# Patient Record
Sex: Male | Born: 1950 | Hispanic: No | Marital: Married | Smoking: Former smoker
Health system: Southern US, Community
[De-identification: ages and names within clinical notes are randomized; demographics above are authoritative.]

## PROBLEM LIST (undated history)

## (undated) DIAGNOSIS — E785 Hyperlipidemia, unspecified: Secondary | ICD-10-CM

## (undated) DIAGNOSIS — I10 Essential (primary) hypertension: Secondary | ICD-10-CM

## (undated) DIAGNOSIS — E291 Testicular hypofunction: Secondary | ICD-10-CM

## (undated) DIAGNOSIS — R739 Hyperglycemia, unspecified: Secondary | ICD-10-CM

## (undated) DIAGNOSIS — G4730 Sleep apnea, unspecified: Secondary | ICD-10-CM

## (undated) DIAGNOSIS — IMO0001 Reserved for inherently not codable concepts without codable children: Secondary | ICD-10-CM

## (undated) DIAGNOSIS — R030 Elevated blood-pressure reading, without diagnosis of hypertension: Secondary | ICD-10-CM

## (undated) HISTORY — DX: Hyperglycemia, unspecified: R73.9

## (undated) HISTORY — PX: OTHER SURGICAL HISTORY: SHX169

## (undated) HISTORY — DX: Reserved for inherently not codable concepts without codable children: IMO0001

## (undated) HISTORY — PX: ANTERIOR CRUCIATE LIGAMENT REPAIR: SHX115

## (undated) HISTORY — DX: Elevated blood-pressure reading, without diagnosis of hypertension: R03.0

## (undated) HISTORY — PX: APPENDECTOMY: SHX54

## (undated) HISTORY — DX: Testicular hypofunction: E29.1

## (undated) HISTORY — DX: Hyperlipidemia, unspecified: E78.5

## (undated) HISTORY — DX: Essential (primary) hypertension: I10

## (undated) HISTORY — DX: Sleep apnea, unspecified: G47.30

---

## 2007-08-02 ENCOUNTER — Ambulatory Visit: Payer: Self-pay | Admitting: Internal Medicine

## 2007-08-02 ENCOUNTER — Encounter (INDEPENDENT_AMBULATORY_CARE_PROVIDER_SITE_OTHER): Payer: Self-pay | Admitting: Nurse Practitioner

## 2007-08-02 LAB — CONVERTED CEMR LAB
ALT: 24 units/L (ref 0–53)
AST: 21 units/L (ref 0–37)
Alkaline Phosphatase: 43 units/L (ref 39–117)
Basophils Absolute: 0 10*3/uL (ref 0.0–0.1)
Basophils Relative: 0 % (ref 0–1)
Chloride: 109 meq/L (ref 96–112)
Creatinine, Ser: 0.72 mg/dL (ref 0.40–1.50)
Eosinophils Absolute: 0.3 10*3/uL (ref 0.0–0.7)
Eosinophils Relative: 6 % — ABNORMAL HIGH (ref 0–5)
HDL: 45 mg/dL (ref >39)
Hemoglobin: 14.7 g/dL (ref 13.0–17.0)
MCHC: 34.8 g/dL (ref 30.0–36.0)
MCV: 88.3 fL (ref 78.0–100.0)
Monocytes Absolute: 0.5 10*3/uL (ref 0.1–1.0)
Neutro Abs: 3.5 10*3/uL (ref 1.7–7.7)
PSA: 0.84 ng/mL (ref 0.10–4.00)
RDW: 13.4 % (ref 11.5–15.5)
TSH: 1.326 microintl units/mL (ref 0.350–5.50)
Testosterone Free: 44.3 pg/mL — ABNORMAL LOW (ref 47.0–244.0)
Total Bilirubin: 0.5 mg/dL (ref 0.3–1.2)
Total CHOL/HDL Ratio: 5.5
VLDL: 44 mg/dL — ABNORMAL HIGH (ref 0–40)

## 2007-08-07 ENCOUNTER — Encounter (INDEPENDENT_AMBULATORY_CARE_PROVIDER_SITE_OTHER): Payer: Self-pay | Admitting: Nurse Practitioner

## 2007-08-25 ENCOUNTER — Ambulatory Visit: Payer: Self-pay | Admitting: Family Medicine

## 2007-08-28 ENCOUNTER — Ambulatory Visit: Payer: Self-pay | Admitting: *Deleted

## 2007-09-22 ENCOUNTER — Ambulatory Visit: Payer: Self-pay | Admitting: Internal Medicine

## 2008-12-17 ENCOUNTER — Ambulatory Visit: Payer: Self-pay | Admitting: Internal Medicine

## 2008-12-17 DIAGNOSIS — E785 Hyperlipidemia, unspecified: Secondary | ICD-10-CM | POA: Insufficient documentation

## 2008-12-17 DIAGNOSIS — E291 Testicular hypofunction: Secondary | ICD-10-CM

## 2008-12-17 DIAGNOSIS — R030 Elevated blood-pressure reading, without diagnosis of hypertension: Secondary | ICD-10-CM

## 2008-12-20 ENCOUNTER — Ambulatory Visit (HOSPITAL_COMMUNITY): Admission: EM | Admit: 2008-12-20 | Discharge: 2008-12-21 | Payer: Self-pay | Admitting: Emergency Medicine

## 2008-12-25 LAB — CONVERTED CEMR LAB
CO2: 29 meq/L (ref 19–32)
Calcium: 8.4 mg/dL (ref 8.4–10.5)
Chloride: 103 meq/L (ref 96–112)
Creatinine, Ser: 0.9 mg/dL (ref 0.4–1.5)
Glucose, Bld: 104 mg/dL — ABNORMAL HIGH (ref 70–99)
Hemoglobin: 14.1 g/dL (ref 13.0–17.0)
MCHC: 34.6 g/dL (ref 30.0–36.0)
MCV: 85.5 fL (ref 78.0–100.0)
Prolactin: 8 ng/mL
RDW: 12.2 % (ref 11.5–14.6)
Testosterone: 219.66 ng/dL — ABNORMAL LOW (ref 350.00–890.00)

## 2010-10-12 LAB — CBC
Hemoglobin: 15.4 g/dL (ref 13.0–17.0)
MCHC: 33.9 g/dL (ref 30.0–36.0)
MCV: 84.7 fL (ref 78.0–100.0)
RBC: 5.35 MIL/uL (ref 4.22–5.81)
RDW: 13.3 % (ref 11.5–15.5)

## 2010-10-12 LAB — DIFFERENTIAL
Basophils Absolute: 0 10*3/uL (ref 0.0–0.1)
Basophils Relative: 0 % (ref 0–1)
Eosinophils Absolute: 0.2 10*3/uL (ref 0.0–0.7)
Eosinophils Relative: 2 % (ref 0–5)
Monocytes Absolute: 0.4 10*3/uL (ref 0.1–1.0)
Monocytes Relative: 5 % (ref 3–12)
Neutro Abs: 6.8 10*3/uL (ref 1.7–7.7)

## 2010-10-12 LAB — PROTIME-INR
INR: 1 (ref 0.00–1.49)
Prothrombin Time: 13.9 seconds (ref 11.6–15.2)

## 2010-10-12 LAB — BASIC METABOLIC PANEL
CO2: 24 mEq/L (ref 19–32)
Calcium: 8.7 mg/dL (ref 8.4–10.5)
Chloride: 107 mEq/L (ref 96–112)
GFR calc Af Amer: >60 mL/min (ref >60)
Glucose, Bld: 131 mg/dL — ABNORMAL HIGH (ref 70–99)
Sodium: 138 mEq/L (ref 135–145)

## 2010-11-17 NOTE — Op Note (Signed)
Bobby Clayton, Bobby Clayton NO.:  [id number]   MEDICAL RECORD NO.:  [id number]          PATIENT TYPE:  INP   LOCATION:  1612                         FACILITY:  [facility]   PHYSICIAN:  Dionne Ano. 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   PROCEDURE:  1. I and D of skin, subcutaneous tissue, bone and nailbed tissue,      right thumb, excisional debridement.  2. Open treatment of distal phalanx fracture, right thumb.  3. Nail bed repair, right thumb.  4. Nail plate removal, right thumb.  5. Moberg advancement flap, right thumb.   SURGEON:  Dionne Ano. Amanda Pea, M.D.   ASSISTANT:  None.   COMPLICATIONS:  None.   ANESTHESIA:  General with axillary block.   TOURNIQUET TIME:  Less than 30 minutes.   INDICATIONS FOR PROCEDURE:  This patient is a 59 year old male who  presents with the above-mentioned diagnosis.  I have counseled him  regarding the risks and benefits of surgery including the risk of  infection, bleeding, anesthesia, damage to normal structures and failure  of surgery to accomplish its intended goals to relieve his symptoms and  restore function.  With this in mind, we will proceed.  All questions  have been encouraged and answered preoperatively.   OPERATIVE PROCEDURE:  With the patient in the supine position, given  anesthesia, taken to the operative suite, time-out was called.  Preoperative checks performed.  The patient was prepped and draped in  the usual sterile fashion.  He had a general anesthetic employed by Dr.  Lucille Passy and  the anesthesia department.  An axillary block was  placed in the holding area and was slightly incomplete.  Once this was  done, the patient then underwent an I and D of skin, subcutaneous  tissue, bone, nailbed, and nail plate tissue.  I then performed nail  plate removal without difficulty.  Greater than 4 liters of saline were  placed through the wound.  This was curettaged and treated.   Once this was done, I performed open treatment of the distal phalanx  fracture with a rongeur, bone-biting instrument, and set the remaining  bone.   Following this, I then performed an incision about the mid lateral line.  The neurovascular bundles were kept volar.  The flexor tendon and its  sheath were kept intact and a flap was elevated nicely.  The large  tongue flap was then inset and advanced as a Moberg advancement flap.  The thumb tip was bent slightly at the PIP joint to accommodate this and  following this, nailbed repair was accomplished with chromic suture and  the flap was then inset with chromic of the 4-0  variety.  The tourniquet  was deflated at less than 30 minutes.  Excellent refill both dorsally  and volarly was noted and I was quite pleased with this and the findings  at this juncture.  I went ahead and performed placement of Adaptic under  the eponychial fold.  Xeroform and Adaptic gauze and a thumb spica  splint were applied without difficulty.  The patient tolerated this  well.   He was to the recovery room in stable condition.  All sponge and needle  counts were reported as correct.   Will plan to see him back in the office in 10-14 days as planned for  suture removal, observation.  Therapy referral for gentle interval range  of motion and once his wound is healed and motion is back, begin  strengthening.  Basically we simply want the wound to heal and will  guide him along with the healing process and perform soft tissue  endeavors.  Once the wound is solid, will then  begin aggressive active  range of motion and following restoration of motion and strengthening,  he will be able work until further notice.  He will be admitted for IV  antibiotics in the form of Ancef.  General postop observation and pain  management.  It has been a pleasure to participate in his care and we  look to participating in his postop recovery.      Dionne Ano. Amanda Pea, M.D.  Electronically Signed     WMG/MEDQ  D:  12/20/2008  T:  12/21/2008  Job:  [id number]

## 2011-03-24 ENCOUNTER — Encounter: Payer: Self-pay | Admitting: Internal Medicine

## 2011-03-29 ENCOUNTER — Encounter: Payer: Self-pay | Admitting: Internal Medicine

## 2011-03-29 ENCOUNTER — Telehealth: Payer: Self-pay | Admitting: Internal Medicine

## 2011-03-29 ENCOUNTER — Ambulatory Visit (INDEPENDENT_AMBULATORY_CARE_PROVIDER_SITE_OTHER)
Admission: RE | Admit: 2011-03-29 | Discharge: 2011-03-29 | Disposition: A | Discharge status: Home / Self Care | Payer: PRIVATE HEALTH INSURANCE | Source: Ambulatory Visit | Attending: Internal Medicine | Admitting: Internal Medicine

## 2011-03-29 ENCOUNTER — Ambulatory Visit (INDEPENDENT_AMBULATORY_CARE_PROVIDER_SITE_OTHER): Payer: PRIVATE HEALTH INSURANCE | Admitting: Internal Medicine

## 2011-03-29 DIAGNOSIS — R079 Chest pain, unspecified: Secondary | ICD-10-CM

## 2011-03-29 DIAGNOSIS — E119 Type 2 diabetes mellitus without complications: Secondary | ICD-10-CM

## 2011-03-29 LAB — COMPREHENSIVE METABOLIC PANEL
Alkaline Phosphatase: 38 U/L — ABNORMAL LOW (ref 39–117)
BUN: 14 mg/dL (ref 6–23)
Creatinine, Ser: 0.8 mg/dL (ref 0.4–1.5)
Glucose, Bld: 99 mg/dL (ref 70–99)
Sodium: 139 mEq/L (ref 135–145)
Total Bilirubin: 0.6 mg/dL (ref 0.3–1.2)

## 2011-03-29 LAB — CBC WITH DIFFERENTIAL/PLATELET
Basophils Relative: 0.3 % (ref 0.0–3.0)
Eosinophils Relative: 6 % — ABNORMAL HIGH (ref 0.0–5.0)
HCT: 42.8 % (ref 39.0–52.0)
Lymphs Abs: 1.8 10*3/uL (ref 0.7–4.0)
MCV: 86.4 fl (ref 78.0–100.0)
Monocytes Absolute: 0.5 10*3/uL (ref 0.1–1.0)
RBC: 4.96 Mil/uL (ref 4.22–5.81)
WBC: 7.1 10*3/uL (ref 4.5–10.5)

## 2011-03-29 LAB — LIPID PANEL
Cholesterol: 202 mg/dL — ABNORMAL HIGH (ref 0–200)
Total CHOL/HDL Ratio: 5
VLDL: 29 mg/dL (ref 0.0–40.0)

## 2011-03-29 LAB — LDL CHOLESTEROL, DIRECT: Direct LDL: 128.4 mg/dL

## 2011-03-29 MED ORDER — NITROGLYCERIN 0.4 MG SL SUBL: 0.4000 mg | SUBLINGUAL_TABLET | SUBLINGUAL | Status: AC | PRN

## 2011-03-29 NOTE — Telephone Encounter (Signed)
Please refer pt to a nutritionist, dx DM (I did not know how to do it)

## 2011-03-29 NOTE — Progress Notes (Signed)
  Subjective:    Patient ID: Bobby Clayton, male    DOB: 1951-04-10, 60 y.o.   MRN: [id number]  HPI Last office visit 12-2008, here with his wife One year history of dyspnea on exertion and chest pain. Pain is located to the left upper chest, no radiation, having consistent CP every time he exercises, it lasts "for a while" after he stops exercising. No associated cough, nausea, mild diaphoresis.  History of borderline diabetes, A1c 6.3 on 2010, [facility] for  followup Also complains of lower extremity varicose veins, they seem more prominent lately. Some skin discoloration..  Past Medical History  Diagnosis Date  . Hyperglycemia     A1C 6.1 08-2007  . Hypogonadism male   . Hyperlipidemia   . Elevated BP     borderline   Past Surgical History  Procedure Date  . 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 No nausea, vomiting, diarrhea, no blood in the stools. Orthopnea? Patient's wife reports he is using 2 pillows to sleep. Denies any anxiety or depression. Bilateral lower extremity edema at the end of the day No GERD, no abdominal pain    Objective:   Physical Exam  Constitutional: He is oriented to person, place, and time. He appears well-developed. No distress.       Overweight appearing  Neck: Normal range of motion. Neck supple. No thyromegaly present.       Short neck  Cardiovascular: Normal rate, regular rhythm and normal heart sounds.   No murmur heard. Pulmonary/Chest: Effort normal and breath sounds normal. No respiratory distress. He has no wheezes. He has no rales.  Abdominal: Soft. Bowel sounds are normal. He exhibits no distension.  There is no tenderness. There is no rebound and no guarding.  Musculoskeletal:       Pretibial hyperpigmentation distally. No large varicose  Veins, edema +/++++  Neurological: He is alert and oriented to person, place, and time.  Skin: He is not diaphoretic.  Psychiatric: He has a normal mood and affect. His behavior is normal. Thought content normal.          Assessment & Plan:  Presents with the following issues: 1. Chest pain consistent with stable angina 2. Diabetes 3. Dyspnea on exertion 4. Suspect sleep apnea (addendum, pt states had a test in the past and was rec a CPAP) 5. Lower extremity edema and skin discoloration 6. H/o low testosterone PLAN: Will obtain chest x-ray and labs EKG --- no acute changes  Continue with aspirin, add nitroglycerin Would refer to cardiology: Stress test versus cardiac catheterization since patient is diabetic and has classic symptoms. ER if symptoms severe At a later time will discuss  sleep apnea treatment  Prescribe medications if appropriate, based on labs Nutritionist referral Exercise program once he is cleared by cardiology. Come back to the office in one month

## 2011-03-29 NOTE — Patient Instructions (Signed)
Get the chest x-ray done  I will ask cardiology to see you soon Continue with aspirin, rest If chest pain, take nitroglycerin use nitroglycerine  under the  tongue every 5 minutes x3. If the pain is severe or prolonged: Go to the ER. Come back in one month

## 2011-03-30 ENCOUNTER — Telehealth: Payer: Self-pay | Admitting: *Deleted

## 2011-03-30 DIAGNOSIS — E119 Type 2 diabetes mellitus without complications: Secondary | ICD-10-CM | POA: Insufficient documentation

## 2011-03-30 MED ORDER — ATORVASTATIN CALCIUM 20 MG PO TABS: 20.0000 mg | ORAL_TABLET | Freq: Every day | ORAL | Status: DC

## 2011-03-30 NOTE — Telephone Encounter (Signed)
Done

## 2011-03-30 NOTE — Telephone Encounter (Signed)
Please place a Diagnosis for DM in patient's chart needed to be able to link/associate to Referral.

## 2011-03-30 NOTE — Telephone Encounter (Signed)
Message copied by Regis Bill on Tue Mar 30, 2011  9:35 AM ------      Message from: Wanda Plump      Created: Tue Mar 30, 2011  7:49 AM       Advise patient      Blood sugar test stable.      Cholesterol needs improvement, LDL is 128, goal is 100. Start Lipitor 20 mg once daily, call #[phone], 3 RF      Other labs normal

## 2011-03-30 NOTE — Telephone Encounter (Signed)
Patient informed of lab results and New Rx sent to pharmacy. Mailed copy of results. Scheduled appointment for Left knee pain 04/06/11 @ 10:30am

## 2011-03-31 NOTE — Telephone Encounter (Signed)
Referral Done.

## 2011-04-05 ENCOUNTER — Ambulatory Visit (INDEPENDENT_AMBULATORY_CARE_PROVIDER_SITE_OTHER): Payer: PRIVATE HEALTH INSURANCE | Admitting: Cardiology

## 2011-04-05 ENCOUNTER — Encounter: Payer: Self-pay | Admitting: Cardiology

## 2011-04-05 DIAGNOSIS — R079 Chest pain, unspecified: Secondary | ICD-10-CM

## 2011-04-05 DIAGNOSIS — E785 Hyperlipidemia, unspecified: Secondary | ICD-10-CM

## 2011-04-05 DIAGNOSIS — R0600 Dyspnea, unspecified: Secondary | ICD-10-CM

## 2011-04-05 DIAGNOSIS — R0609 Other forms of dyspnea: Secondary | ICD-10-CM

## 2011-04-05 NOTE — Patient Instructions (Signed)
We will schedule you for a lexiscan myoview study. (Nuclear stress test)  Continue your medications.  Lexiscan Stress Test Your caregiver has ordered a Lexiscan Stress Test with nuclear imaging. The purpose of this test is to evaluate the blood supply to your heart muscle. This procedure is referred to as a "Non-Invasive Stress Test." This is because other than having an IV started in your vein, nothing is inserted or "invades" your body. Cardiac stress tests are done to find areas of poor blood flow to the heart by determining the extent of coronary artery disease (CAD). Some patients exercise on a treadmill, which naturally increases the blood flow to your heart. However, almost half of the patients undergoing a treadmill stress test each year are unable to exercise adequately. This is due to various medical conditions. For these patients, a pharmacologic/chemical stress agent called Eugenie Birks is used on patients without a history of asthma. This medicine will mimic walking on a treadmill by temporarily increasing your coronary blood flow. The side effects of this medicine include:  Headache.   Dizziness.   Shortness of breath.   Flushing.   Chest pain/pressure.   Feeling sick to your stomach (nauseous).   Increased heart rate.   Abdominal discomfort.   Low blood pressure.  BEFORE THE PROCEDURE  Avoid all forms of caffeine 24 hours before your test. This includes coffee, tea (even decaffeinated brands), caffeinated sodas, chocolate, cocoa and certain pain medications that contain caffeine.   Do not eat anything 6 hours before the test. In hospitalized patients, this means nothing by mouth after midnight.   Patients with diabetes that are not hospitalized (outpatients) should talk to their caregiver about how much, if any, insulin or oral diabetic medications they should take the day of the test. You should also talk about the type of light snack that you should eat the morning of the  test.   Patients with diabetes that are hospitalized (inpatients) will follow the cardiologist's written instructions that will be given to you.   Outpatients should bring a snack. Use the hospital vending machines so that you may eat right after the stress phase.   Wear comfortable shoes. There is at least an hour wait before the stress phase of nuclear scanning is done. The total procedure time is approximately 3 hours.   The following medications should be stopped 24 hours before your stress test: Beta-blockers and nitroglycerine (patches or paste should be removed 4 hours before the test).   Women, tell your caregiver if there is any possibility that you may be pregnant or if you are currently breastfeeding.  PROCEDURE There are two separate nuclear images taken using a nuclear camera. These images require a small dose of a radiotracer called an isotope. Most diagnostic nuclear medicine procedures result in low radiation exposure. Allergic reactions to the isotope may include slight redness going up the arm and pain during the injection. However, these reactions are extremely rare and usually mild. Eugenie Birks is given very rapidly over 10-15 seconds in the vein (intravenously) followed by a nuclear isotope. The medication causes the arteries in your heart to widen (dilate), and the nuclear isotope lights up those arteries so that the nuclear images are clear. Together, this shows whether the coronary blood flow is normal or abnormal. During this stress phase, you will be connected to an EKG machine. Your blood pressure and oxygen levels will be monitored by the cardiologist and cardiac nurse. This part usually lasts 5-10 minutes. For inpatients, the  procedure is done in the patient's room. For outpatients, the procedure is done in the stress lab.  The "Resting Phase" is done before the Lexiscan injection and shows how your heart functions at rest. The "Stress Phase" is done about 1 hour after the  Lexiscan injection and determines how your heart functions under stress.  LEXISCAN STRESS TEST IS USEFUL FOR DETERMINING:  The adequacy of blood flow to your heart during increased levels of activity in order to clear you for discharge home.   The extent of coronary artery blockage caused by CAD.   Your prognosis if you have suffered a heart attack.   The effectiveness of cardiac procedures done, such as an angioplasty, which can increase the circulation in your coronary arteries.   Cause(s) of chest pain/pressure.  RESULTS Not all test results are available during your visit. If your test results are not back during the visit, make an appointment with your caregiver to find out the results. Do not assume everything is normal if you have not heard from your caregiver or the medical facility. It is important for you to follow up on all of your test results. Document Released: 11/07/2008  [facility] Patient Information 2011 [place], [place].

## 2011-04-06 ENCOUNTER — Ambulatory Visit (INDEPENDENT_AMBULATORY_CARE_PROVIDER_SITE_OTHER): Payer: PRIVATE HEALTH INSURANCE | Admitting: Internal Medicine

## 2011-04-06 ENCOUNTER — Encounter: Payer: Self-pay | Admitting: Internal Medicine

## 2011-04-06 VITALS — BP 146/90 | HR 87 | Temp 98.7°F | Resp 16 | Wt 239.4 lb

## 2011-04-06 DIAGNOSIS — M25569 Pain in unspecified knee: Secondary | ICD-10-CM

## 2011-04-06 DIAGNOSIS — R079 Chest pain, unspecified: Secondary | ICD-10-CM | POA: Insufficient documentation

## 2011-04-06 MED ORDER — HYDROCODONE-ACETAMINOPHEN 7.5-750 MG PO TABS: 1.0000 | ORAL_TABLET | Freq: Every evening | ORAL | Status: AC | PRN

## 2011-04-06 NOTE — Patient Instructions (Signed)
Motrin during the day vicodin at night Came back in about 3 weeks as planned

## 2011-04-06 NOTE — Assessment & Plan Note (Signed)
He does have symptoms of exertional chest pain and dyspnea. He has multiple cardiac risk factors including diabetes and hyperlipidemia. There is some family history of cardiac disease. I recommended a nuclear stress test to further evaluate his cardiac risk. Given his recent knee problems he will not be able to walk on a treadmill so we will schedule him for a Lexiscan Myoview study. If this is normal then he may need to be evaluated for possible sleep apnea.

## 2011-04-06 NOTE — Assessment & Plan Note (Addendum)
Left knee pain for one month, getting worse, reports a previous anterior cruciate ligament surgery in that knee ears ago. Plan: Continue with Motrin which is helping, GI side effects discussed Vicodin at night Refer back to orthopedic surgery, reports that he was seen by [facility] ortho

## 2011-04-06 NOTE — Progress Notes (Signed)
Bobby Clayton Date of Birth: 06-04-1951   History of Present Illness: [name] is a pleasant 60 year old white male who is seen at the request of Dr. Drue Novel for evaluation of dyspnea and chest pain. He reports that his wife says that he pants a lot. It is worse with exertion. Initially he said that he doesn't feel short of breath but on further questioning he has experienced left parasternal pain and difficulty breathing with exertion. This is relieved with rest. He cannot recall how long this has been going on. There is concern that he may also have sleep apnea. He has had no prior cardiac evaluation. He does have one sister who had a heart disease and had to have surgery.  Current Outpatient Prescriptions on File Prior to Visit  Medication Sig Dispense Refill  . aspirin 81 MG tablet Take 81 mg by mouth daily.        Marland Kitchen atorvastatin (LIPITOR) 20 MG tablet Take 1 tablet (20 mg total) by mouth daily.  30 tablet  3  . fish oil-omega-3 fatty acids 1000 MG capsule Take 1 g by mouth daily.        . Multiple Vitamins-Minerals (CENTRUM ULTRA MENS PO) Take 1 each by mouth daily.        Marland Kitchen NIACIN PO Take by mouth daily.       . nitroGLYCERIN (NITROSTAT) 0.4 MG SL tablet Place 1 tablet (0.4 mg total) under the tongue every 5 (five) minutes as needed for chest pain.  50 tablet  0    Allergies  Allergen Reactions  . Cyanocobalamin Itching    B12.    Past Medical History  Diagnosis Date  . Hyperglycemia     A1C 6.1 08-2007  . Hypogonadism male   . Hyperlipidemia   . Elevated BP     borderline    Past Surgical History  Procedure Date  . Appendectomy   . Anterior cruciate ligament repair     left    History  Smoking status  . Former Smoker  Smokeless tobacco  . Not on file    History  Alcohol Use  . Yes    socially    Family History  Problem Relation Age of Onset  . Cancer Father     colon  . Heart disease Sister     surgery    Review of Systems: The review of systems  is positive for left knee pain. This has limited his activity. He has had a previous anterior cruciate ligament repair in that knee..  All other systems were reviewed and are negative.  Physical Exam: BP 140/84  Pulse 80  Ht 5\' 7"  (1.702 m)  Wt 240 lb (108.863 kg)  BMI 37.59 kg/m2 The patient is alert and oriented x 3.  The mood and affect are normal.  The skin is warm and dry.  Color is normal.  The HEENT exam reveals that the sclera are nonicteric.  The mucous membranes are moist.  The carotids are 2+ without bruits.  There is no thyromegaly.  There is no JVD.  The lungs are clear.  The chest wall is non tender.  The heart exam reveals a regular rate with a normal S1 and S2.  There are no murmurs, gallops, or rubs.  The PMI is not displaced.   Abdominal exam reveals good bowel sounds.  There is no guarding or rebound.  There is no hepatosplenomegaly or tenderness.  There are no masses.  Exam of the  legs reveal no clubbing, cyanosis, or edema.  The legs are without rashes.  The distal pulses are intact.  Cranial nerves II - XII are intact.  Motor and sensory functions are intact.  The gait is normal.  LABORATORY DATA: From 03/30/2011 his complete chemistry panel was unremarkable. CBC was unremarkable. TSH was 0.9. Total cholesterol 202, triglycerides 145, HDL 41, LDL 128. A1c was 6.4%. ECG today is normal.  Assessment / Plan:

## 2011-04-06 NOTE — Progress Notes (Signed)
  Subjective:    Patient ID: Bobby Clayton, male    DOB: 09-09-1950, 60 y.o.   MRN: [id number]  HPI Knee pain x 1 month, started 5 days after he started a walking program. Initially on-off, now pain is constatnt , even at night, throbbing Past Medical History  Diagnosis Date  . Hyperglycemia     A1C 6.1 08-2007  . Hypogonadism male   . Hyperlipidemia   . Elevated BP     borderline   Past Surgical History  Procedure Date  . Appendectomy   . Anterior cruciate ligament repair     left      Review of Systems No actual knee swelling, calf pain or warmness. No locking but occasionally the knee gave up    Objective:   Physical Exam Alert, oriented, in no apparent distress Towels symmetric and nontender Right knee normal Left knee: Well-healed surgical scars, no red or hot, very mild swelling? Slightly decrease range of motion, unable to fully flex. No pain to lateral pressure , drawer sign negative        Assessment & Plan:

## 2011-04-07 ENCOUNTER — Encounter: Payer: Self-pay | Admitting: *Deleted

## 2011-04-07 NOTE — Assessment & Plan Note (Signed)
To have a stress test soon, per pt: cardiology is aware of knee pain, he will not do a treadmill.

## 2011-04-14 ENCOUNTER — Ambulatory Visit (HOSPITAL_COMMUNITY): Payer: PRIVATE HEALTH INSURANCE | Attending: Cardiology | Admitting: Radiology

## 2011-04-14 DIAGNOSIS — R0789 Other chest pain: Secondary | ICD-10-CM

## 2011-04-14 DIAGNOSIS — E119 Type 2 diabetes mellitus without complications: Secondary | ICD-10-CM

## 2011-04-14 DIAGNOSIS — R0989 Other specified symptoms and signs involving the circulatory and respiratory systems: Secondary | ICD-10-CM

## 2011-04-14 DIAGNOSIS — R079 Chest pain, unspecified: Secondary | ICD-10-CM | POA: Insufficient documentation

## 2011-04-14 MED ORDER — TECHNETIUM TC 99M TETROFOSMIN IV KIT
11.0000 | PACK | Freq: Once | INTRAVENOUS | Status: AC | PRN
  Administered 2011-04-14: 11 via INTRAVENOUS

## 2011-04-14 MED ORDER — TECHNETIUM TC 99M TETROFOSMIN IV KIT
33.0000 | PACK | Freq: Once | INTRAVENOUS | Status: AC | PRN
  Administered 2011-04-14: 33 via INTRAVENOUS

## 2011-04-14 MED ORDER — REGADENOSON 0.4 MG/5ML IV SOLN
0.4000 mg | Freq: Once | INTRAVENOUS | Status: AC
  Administered 2011-04-14: 0.4 mg via INTRAVENOUS

## 2011-04-14 NOTE — Progress Notes (Signed)
[facility] SITE 3 NUCLEAR MED [address] [place] [address] [phone]  Cardiology Nuclear Med Study  Bobby Clayton is a 60 y.o. male [id number] 10/24/1950   Nuclear Med Background Indication for Stress Test:  Evaluation for Ischemia History:  No previous documented CAD Cardiac Risk Factors: Family History - CAD, History of Smoking, Lipids and NIDDM  Symptoms:  Chest Tightness with Exertion (last episode of chest discomfort was about 3-weeks ago), Diaphoresis, Dizziness, DOE and Fatigue   Nuclear Pre-Procedure Caffeine/Decaff Intake:  None NPO After: 7:30pm   Lungs:  Clear.  O2 SAT 95% on RA. IV 0.9% NS with Angio Cath:  20g  IV Site: R Antecubital x 1, tolerated well IV Started by:  Irean Hong, RN  Chest Size (in):  46 Cup Size: n/a  Height: 5\' 7"  (1.702 m)  Weight:  236 lb (107.049 kg)  BMI:  Body mass index is 36.96 kg/(m^2). Tech Comments:  n/a    Nuclear Med Study 1 or 2 day study: 1 day  Stress Test Type:  Treadmill/Lexiscan  Reading MD: Marca Ancona, MD  Order Authorizing Provider:  Peter [place], MD  Resting Radionuclide: Technetium 75m Tetrofosmin  Resting Radionuclide Dose: 11.0 mCi   Stress Radionuclide:  Technetium 33m Tetrofosmin  Stress Radionuclide Dose: 33.0 mCi           Stress Protocol Rest HR: 75 Stress HR: 115  Rest BP: 134/89 Stress BP: 158/98  Exercise Time (min): 2:00 METS: n/a   Predicted Max HR: 161 bpm % Max HR: 71.43 bpm Rate Pressure Product: 21308   Dose of Adenosine (mg):  n/a Dose of Lexiscan: 0.4 mg  Dose of Atropine (mg): n/a Dose of Dobutamine: n/a mcg/kg/min (at max HR)  Stress Test Technologist: Smiley Houseman, CMA-N  Nuclear Technologist:  Domenic Polite, CNMT     Rest Procedure:  Myocardial perfusion imaging was performed at rest 45 minutes following the intravenous administration of Technetium 84m Tetrofosmin.  Rest ECG: IRBBB with nonspecific T-wave changes.  Stress Procedure:  The patient  received IV Lexiscan 0.4 mg over 15-seconds with concurrent low level exercise and then Technetium 38m Tetrofosmin was injected at 30-seconds while the patient continued walking one more minute.  There were no significant changes with Lexiscan, occasional PVC's.  Quantitative spect images were obtained after a 45-minute delay.  Stress ECG: No significant change from baseline ECG  QPS Raw Data Images:  Normal; no motion artifact; normal heart/lung ratio. Stress Images:  Mild primarily basal inferior perfusion defect.  Small apical perfusion defect.  Rest Images:  Mild primarily basal inferior perfusion defect.  Subtraction (SDS):  Mild fixed basal inferior perfusion defect.  Small reversible apical perfusion defect.  Transient Ischemic Dilatation (Normal <1.22):  0.90 Lung/Heart Ratio (Normal <0.45):  0.35  Quantitative Gated Spect Images QGS EDV:  116 ml QGS ESV:  54 ml QGS cine images:  Borderline reduced LV systolic function without regionality.  QGS EF: 54%  Impression Exercise Capacity:  Lexiscan with low level exercise. BP Response:  Normal blood pressure response. Clinical Symptoms:  mild dyspnea, warmth.  ECG Impression:  Rare PVCs.  No significant ST segment changes.  Comparison with Prior Nuclear Study: No previous nuclear study performed  Overall Impression:  Low risk stress nuclear study.  There is a mild fixed inferior perfusion defect that this likely due to diaphragmatic attenuation.  There is a small reversible apical perfusion defect also.  Cannot rule out ischemia here but defect is small and  may be apical thinning. EF 54% with borderline reduced LV systolic function but no regionality. Muriel Hannold [employer]

## 2011-04-20 ENCOUNTER — Telehealth: Payer: Self-pay | Admitting: *Deleted

## 2011-04-20 ENCOUNTER — Encounter: Payer: Self-pay | Admitting: Internal Medicine

## 2011-04-20 ENCOUNTER — Ambulatory Visit (INDEPENDENT_AMBULATORY_CARE_PROVIDER_SITE_OTHER): Payer: PRIVATE HEALTH INSURANCE | Admitting: Internal Medicine

## 2011-04-20 VITALS — BP 138/78 | HR 86 | Temp 98.3°F | Resp 16 | Wt 240.2 lb

## 2011-04-20 DIAGNOSIS — R059 Cough, unspecified: Secondary | ICD-10-CM

## 2011-04-20 DIAGNOSIS — R05 Cough: Secondary | ICD-10-CM

## 2011-04-20 DIAGNOSIS — E119 Type 2 diabetes mellitus without complications: Secondary | ICD-10-CM

## 2011-04-20 DIAGNOSIS — G4730 Sleep apnea, unspecified: Secondary | ICD-10-CM

## 2011-04-20 DIAGNOSIS — R0609 Other forms of dyspnea: Secondary | ICD-10-CM | POA: Insufficient documentation

## 2011-04-20 DIAGNOSIS — E785 Hyperlipidemia, unspecified: Secondary | ICD-10-CM

## 2011-04-20 DIAGNOSIS — R079 Chest pain, unspecified: Secondary | ICD-10-CM

## 2011-04-20 MED ORDER — PRAVASTATIN SODIUM 40 MG PO TABS: 40.0000 mg | ORAL_TABLET | Freq: Every evening | ORAL | Status: DC

## 2011-04-20 NOTE — Assessment & Plan Note (Addendum)
Seasonal cough , occ wheezing at night per wife .He works in a Civil engineer, contracting. No GERD Plan: refer to pulmonary, likely will need formal PFTs

## 2011-04-20 NOTE — Assessment & Plan Note (Signed)
Question of a reaction to Lipitor, I recommend Pravachol.

## 2011-04-20 NOTE — Telephone Encounter (Signed)
Message copied by Lorayne Bender on Tue Apr 20, 2011 11:00 AM ------      Message from: [place], PETER M      Created: Sun Apr 18, 2011  7:51 PM       Low risk nuclear study. Somewhat equivocal for ischemia. EF 54%. Given multiple risk factors and high pretest likelihood for disease we will need to discuss cardiac cath. Lets start him on metoprolol 25 mg bid and schedule a follow up visit.      Theron Arista [place]

## 2011-04-20 NOTE — Telephone Encounter (Signed)
Notified of stress test results. He refuses to go on another medication. States he is sensitive to meds and he is a Designer, television/film set and "thinks doctors put people on meds without them needing it". He will make an app to come in to discuss test results. Made him an app for next Tues 10/23

## 2011-04-20 NOTE — Assessment & Plan Note (Signed)
Chest pain, stress test low risk but equivocal, cardiology has recommended metoprolol twice a day, patient declined to take . Patient reports he will see cardiology this week and discuss with them

## 2011-04-20 NOTE — Assessment & Plan Note (Addendum)
Chronic dyspnea on exertion, differential diagnosis is large but includes deconditioning, CAD, asthma, et Karie Soda. O2 sat normal Once he is cleared by cardiology I would recommend gradual return to routine exercises. Cough may be part of the problem although he has DOE w/ w-o cough Refer to pulmonary

## 2011-04-20 NOTE — Assessment & Plan Note (Addendum)
Well controlled, on diet only Nutritionist referral

## 2011-04-20 NOTE — Progress Notes (Signed)
  Subjective:    Patient ID: Bobby Clayton, male    DOB: [birth year], 60 y.o.   MRN: [id number]  HPI Routine office visit Here with his wife. All the labs and procedures  Done  since the last office visit were reviewed. See assessment an plan.  Past Medical History  Diagnosis Date  . Hyperglycemia     A1C 6.1 08-2007  . Hypogonadism male   . Hyperlipidemia   . Elevated BP     borderline  . Sleep apnea    Past Surgical History  Procedure Date  . Appendectomy   . Anterior cruciate ligament repair     left     Review of Systems Developed a left hand swelling 3 days ago, no injury, area was not hot but it  was tender to touch. He thought swelling was related to the Lipitor and self discontinued. Now feels better. Denies generalized myalgias. Denies any lip or tongue swelling, no rash He had chest pain but  no further events since last OV. He still has dyspnea on exertion. Denies orthopnea, he does have daily cough with some whitish sputum, sx seasonal, experience it almost every Fall, thinks related to allergies, allegra usually helps. No wheezing. The patient is a nonsmoker. No GERD    Objective:   Physical Exam  Constitutional: He appears well-developed.       Overweight appearing  HENT:  Head: Normocephalic and atraumatic.  Cardiovascular: Normal rate, regular rhythm and normal heart sounds.   No murmur heard. Pulmonary/Chest: No respiratory distress. He has no wheezes. He has no rales.          Assessment & Plan:  Face-to-face more than 25 minutes due to chart review, I tried to answer as many questions as I could from the patient and his wife .

## 2011-04-20 NOTE — Assessment & Plan Note (Signed)
She reports a history of sleep apnea in the past, at some point was recommended a CPAP. Recommend pulmonary  evaluation although is very likely that  weight loss will significantly help

## 2011-04-27 ENCOUNTER — Encounter: Payer: Self-pay | Admitting: Cardiology

## 2011-04-27 ENCOUNTER — Ambulatory Visit (INDEPENDENT_AMBULATORY_CARE_PROVIDER_SITE_OTHER): Payer: PRIVATE HEALTH INSURANCE | Admitting: Cardiology

## 2011-04-27 VITALS — BP 143/99 | HR 82 | Ht 67.0 in | Wt 237.0 lb

## 2011-04-27 DIAGNOSIS — R0600 Dyspnea, unspecified: Secondary | ICD-10-CM

## 2011-04-27 DIAGNOSIS — R0609 Other forms of dyspnea: Secondary | ICD-10-CM

## 2011-04-27 DIAGNOSIS — E785 Hyperlipidemia, unspecified: Secondary | ICD-10-CM

## 2011-04-27 DIAGNOSIS — R030 Elevated blood-pressure reading, without diagnosis of hypertension: Secondary | ICD-10-CM

## 2011-04-27 NOTE — Assessment & Plan Note (Signed)
Blood pressure readings are still mildly elevated. I think with lifestyle modification regular exercise he may be able to control his blood pressure without medication. He is going to monitor his blood pressure regularly.

## 2011-04-27 NOTE — Patient Instructions (Signed)
Monitor your blood pressure at home. We would like to see the top number less than 135 and the bottom number less than 85.  Work on regular aerobic exercise.  Keep your weight under control.   If you have increase in chest pain or shortness of breath let me know. Otherwise I will see you as needed.

## 2011-04-27 NOTE — Progress Notes (Signed)
Bobby Clayton Date of Birth: [birth year]   History of Present Illness: Bobby Clayton is seen today for followup after his recent stress Myoview study. He reports no symptoms of chest pain. He really denies any significant shortness of breath unless he is doing something extreme and then he just has air hunger. He has started an aerobic exercise program.  Current Outpatient Prescriptions on File Prior to Visit  Medication Sig Dispense Refill  . aspirin 81 MG tablet Take 81 mg by mouth daily.        . fish oil-omega-3 fatty acids 1000 MG capsule Take 1 g by mouth daily.        Marland Kitchen HYDROcodone-acetaminophen (VICODIN ES) 7.5-750 MG per tablet Take 1 tablet by mouth at bedtime as needed for pain.  30 tablet  1  . Multiple Vitamins-Minerals (CENTRUM ULTRA MENS PO) Take 1 each by mouth daily.        Marland Kitchen NIACIN PO Take by mouth daily.       . nitroGLYCERIN (NITROSTAT) 0.4 MG SL tablet Place 1 tablet (0.4 mg total) under the tongue every 5 (five) minutes as needed for chest pain.  50 tablet  0  . pravastatin (PRAVACHOL) 40 MG tablet Take 1 tablet (40 mg total) by mouth every evening.  30 tablet  6    Allergies  Allergen Reactions  . Atorvastatin     Hand edema & weakness  . Cyanocobalamin Itching    B12.    Past Medical History  Diagnosis Date  . Hyperglycemia     A1C 6.1 08-2007  . Hypogonadism male   . Hyperlipidemia   . Elevated BP     borderline  . Sleep apnea     Past Surgical History  Procedure Date  . Appendectomy   . Anterior cruciate ligament repair     left    History  Smoking status  . Former Smoker  Smokeless tobacco  . Not on file    History  Alcohol Use  . Yes    socially    Family History  Problem Relation Age of Onset  . Cancer Father     colon  . Heart disease Sister     surgery    Review of Systems: The review of systems is positive for left knee pain. This has limited his activity. He has had a previous anterior cruciate ligament repair in that  knee..  All other systems were reviewed and are negative.  Physical Exam: BP 143/99  Pulse 82  Ht 5\' 7"  (1.702 m)  Wt 237 lb (107.502 kg)  BMI 37.12 kg/m2 The patient is alert and oriented x 3.  The mood and affect are normal.  The skin is warm and dry.  Color is normal.  The HEENT exam reveals that the sclera are nonicteric.  The mucous membranes are moist.  The carotids are 2+ without bruits.  There is no thyromegaly.  There is no JVD.  The lungs are clear.  The chest wall is non tender.  The heart exam reveals a regular rate with a normal S1 and S2.  There are no murmurs, gallops, or rubs.  The PMI is not displaced.   Abdominal exam reveals good bowel sounds.  There is no guarding or rebound.  There is no hepatosplenomegaly or tenderness.  There are no masses.  Exam of the legs reveal no clubbing, cyanosis, or edema.  The legs are without rashes.  The distal pulses are intact.  Cranial nerves  II - XII are intact.  Motor and sensory functions are intact.  The gait is normal.  LABORATORY DATA: His Myoview study showed a small fixed inferior wall defect that was felt to be artifactual. There was slight reversibility the apex. His ejection fraction was normal. This is felt to be a low risk study.  Assessment / Plan:

## 2011-04-27 NOTE — Assessment & Plan Note (Addendum)
Based on his stress test results I think that he is low risk for coronary disease. I think much of his symptoms are related to deconditioning. I recommended a regular aerobic exercise program with focus on weight loss. I would continue with risk factor modification at this point unless he has worsening chest pain or shortness of breath. I will be happy to see when necessary.

## 2011-04-28 ENCOUNTER — Ambulatory Visit: Payer: PRIVATE HEALTH INSURANCE | Admitting: Internal Medicine

## 2011-05-18 ENCOUNTER — Institutional Professional Consult (permissible substitution): Payer: PRIVATE HEALTH INSURANCE | Admitting: Pulmonary Disease

## 2011-06-11 ENCOUNTER — Encounter: Payer: Self-pay | Admitting: Pulmonary Disease

## 2011-06-11 ENCOUNTER — Ambulatory Visit (INDEPENDENT_AMBULATORY_CARE_PROVIDER_SITE_OTHER): Payer: PRIVATE HEALTH INSURANCE | Admitting: Pulmonary Disease

## 2011-06-11 VITALS — BP 146/86 | HR 84 | Temp 97.9°F | Ht 67.0 in | Wt 241.4 lb

## 2011-06-11 DIAGNOSIS — R0609 Other forms of dyspnea: Secondary | ICD-10-CM

## 2011-06-11 NOTE — Assessment & Plan Note (Signed)
The patient has dyspnea on exertion for the last 1-1/2 years, but primarily occurs on moderate to heavy exertional activities.  He has a fairly unremarkable chest x-ray, and a negative cardiac workup recently.  He has clear lung fields today, and nothing to suggest asthma by history.  I have reviewed the various causes of shortness of breath with him, including lung issues, cardiac issues, as well his weight and conditioning.  I do think he needs full pulmonary function studies or evaluation, and if unremarkable, his symptoms are probably due to his weight and conditioning.

## 2011-06-11 NOTE — Progress Notes (Signed)
  Subjective:    Patient ID: Bobby Clayton, male    DOB: 02/03/1951, 60 y.o.   MRN: [id number]  HPI The patient is a 60 year old male who I've been asked to see for dyspnea on exertion.  The patient notes that he has had dyspnea on exertion for the last 1-1/2 years, and does not feel that it is getting worse.  However, he admits that he is not pushing himself currently as in the past.  He can walk long distances on flat ground fairly well, but will get winded with any type of incline.  He is currently walking on a treadmill at 3 miles per hour for 30 minutes, and doing this at least 3 times a week.  He does admit that he will get winded bringing groceries in from the car, and also going up one flight of stairs.  He has had a recent cardiac evaluation that was unremarkable.  He has minimal smoking history, and no history of asthma.  He denies any consistent cough that is bothersome to him, and will only produce mucus if he has postnasal drip or a URI.  He has not had recent pulmonary function studies, but has had a recent chest x-ray that showed cardiomegaly but clear lung fields.  He has had blood work done which showed no anemia or abnormal thyroid studies.   Review of Systems  Constitutional: Negative for fever and unexpected weight change.  HENT: Negative for ear pain, nosebleeds, congestion, sore throat, rhinorrhea, sneezing, trouble swallowing, dental problem, postnasal drip and sinus pressure.   Eyes: Negative for redness and itching.  Respiratory: Positive for shortness of breath. Negative for cough, chest tightness and wheezing.   Cardiovascular: Negative for palpitations and leg swelling.  Gastrointestinal: Negative for nausea and vomiting.  Genitourinary: Negative for dysuria.  Musculoskeletal: Negative for joint swelling.  Skin: Negative for rash.  Neurological: Negative for headaches.  Hematological: Does not bruise/bleed easily.  Psychiatric/Behavioral: Negative for dysphoric mood.  The patient is not nervous/anxious.        Objective:   Physical Exam Constitutional:  Overweight male, no acute distress  HENT:  Nares patent without discharge  Oropharynx without exudate, palate and uvula are elongated.   Eyes:  Perrla, eomi, no scleral icterus  Neck:  No JVD, no TMG  Cardiovascular:  Normal rate, regular rhythm, no rubs or gallops.  No murmurs        Intact distal pulses  Pulmonary :  Normal breath sounds, no stridor or respiratory distress   No rales, rhonchi, or wheezing  Abdominal:  Soft, nondistended, bowel sounds present.  No tenderness noted.   Musculoskeletal:  Minimal right lower extremity edema noted at ankle, none on left  Lymph Nodes:  No cervical lymphadenopathy noted  Skin:  No cyanosis noted  Neurologic:  Alert, appropriate, moves all 4 extremities without obvious deficit.         Assessment & Plan:

## 2011-06-11 NOTE — Patient Instructions (Signed)
Will schedule for breathing tests, and call you with the results. Work on weight loss and conditioning.

## 2011-06-16 ENCOUNTER — Ambulatory Visit (INDEPENDENT_AMBULATORY_CARE_PROVIDER_SITE_OTHER): Payer: PRIVATE HEALTH INSURANCE | Admitting: Pulmonary Disease

## 2011-06-16 ENCOUNTER — Telehealth: Payer: Self-pay | Admitting: Pulmonary Disease

## 2011-06-16 DIAGNOSIS — R0989 Other specified symptoms and signs involving the circulatory and respiratory systems: Secondary | ICD-10-CM

## 2011-06-16 DIAGNOSIS — E663 Overweight: Secondary | ICD-10-CM

## 2011-06-16 LAB — PULMONARY FUNCTION TEST

## 2011-06-16 NOTE — Telephone Encounter (Signed)
[name], please let pt know that his breathing studies were essentially normal.  Nothing to explain his sob.  Would like him to work on weight reduction, and pushing himself when exercising.  If he continues to have issues, we can do further testing.  Please let me know if he has any questions or if he would like to speak with me directly.

## 2011-06-16 NOTE — Progress Notes (Signed)
PFT done today. 

## 2011-06-17 NOTE — Telephone Encounter (Signed)
Pt's wife, Sigourney Sink,  is returning [facility] call & can be reached at [phone].  Antionette Fairy

## 2011-06-18 NOTE — Telephone Encounter (Signed)
okk with me to nutritional services at [facility].

## 2011-06-18 NOTE — Telephone Encounter (Signed)
LMOM for Rita TCB

## 2011-06-18 NOTE — Telephone Encounter (Signed)
Called and spoke with pt's wife, Bobby Clayton, and informed her of PFT results and KC's recs.  Bobby Clayton verbalized understanding.  Bobby Clayton wanted to know if [facility] would be willing to refer pt to a nutritionist to help with pt's weight and having healthy eating habits.  KC, please advise if we can send a referral for this.  Thanks.

## 2011-06-21 NOTE — Telephone Encounter (Signed)
LMOM for Rita TCB 

## 2011-06-21 NOTE — Telephone Encounter (Signed)
Pt is returning [name]'s call & asked her to call back.  Bobby Clayton

## 2011-06-21 NOTE — Telephone Encounter (Signed)
LMOM for Bobby Clayton TCB 

## 2011-06-22 NOTE — Telephone Encounter (Signed)
LMOM for Bobby Clayton and informed her order sent to [facility] for Nutritionist.

## 2011-06-28 ENCOUNTER — Encounter: Payer: Self-pay | Admitting: Pulmonary Disease

## 2011-07-13 ENCOUNTER — Encounter: Payer: 59 | Attending: Pulmonary Disease | Admitting: *Deleted

## 2011-07-13 ENCOUNTER — Encounter: Payer: Self-pay | Admitting: *Deleted

## 2011-07-13 DIAGNOSIS — E669 Obesity, unspecified: Secondary | ICD-10-CM | POA: Insufficient documentation

## 2011-07-13 DIAGNOSIS — Z713 Dietary counseling and surveillance: Secondary | ICD-10-CM | POA: Insufficient documentation

## 2011-07-13 NOTE — Patient Instructions (Signed)
Goals: Aim for 1 cup rice at a meal which = 3 Carb Choices Aim for 3-4 Carb Choices / meal, 0-2 Carbs / snack if hungry Read Food Labels for Total Carbohydrate of foods Look into bike or gym for increased activity level, goal is 30 minutes 5 x / week ultimately Consider activities that won't aggravate foot or knee issues

## 2011-07-13 NOTE — Progress Notes (Signed)
  Medical Nutrition Therapy:  Appt start time: 1000 end time:  1100.   Assessment:  Primary concerns today: Patient here for assistance with weight loss. He works 2nd shift in [employer]. His wife prepares a hot lunch and he eats left overs for supper. He used to be active with martial arts, but not anymore due to foot injuries, finances and work schedule.    MEDICATIONS: see list   DIETARY INTAKE:  Usual eating pattern includes 3 meals and 2 snacks per day.  Everyday foods include good variety of all food groups.  Avoided foods include fried or fatty foods.    24-hr recall:  B ( AM): eating now; oats and coffee, 4 oz OJ, 1 - 2 slices bread (Hawaiian)  Snk ( AM): none  L ( PM): eat out 2-4 times/ week, hot meal of rice or bread, meat, vegetables, (more brown vs. White) Snk ( PM): vending machine (candy, cookies, with water or coffee with cream) D ( PM): left overs from home meal, water Snk ( PM): Hawaiian or other bread with oleo and jelly, occasionally fruit Beverages: water, coffee, diet coke  Usual physical activity: none lately due to foot problems. (Has treadmill at home.)  Estimated energy needs: 1600 calories 180 g carbohydrates 120 g protein 44 g fat  Progress Towards Goal(s):  In progress.   Nutritional Diagnosis:  NI-1.5 Excessive energy intake As related to obesity.  As evidenced by BMI of 36.9%.    Intervention:  Nutrition counseling provided for basic nutrition, increased activity level and carb counting. Recommend ways to increase activity without being on his feet such as bicycling (stationary or regular), arm chair exercises, or swimming. He is interested in bike activities Goals: Aim for 1 cup rice at a meal which = 3 Carb Choices Aim for 3-4 Carb Choices / meal, 0-2 Carbs / snack if hungry Read Food Labels for Total Carbohydrate of foods Look into bike or gym for increased activity level, goal is 30 minutes 5 x / week ultimately Consider  activities that won't aggravate foot or knee issues    Handouts given during visit include:  Carb Counting and Label Reading handouts  Menu Planner   Meal Plan Card  Monitoring/Evaluation:  Dietary intake, exercise, read food labels, and body weight in 2 month(s).

## 2011-07-19 ENCOUNTER — Ambulatory Visit: Payer: PRIVATE HEALTH INSURANCE | Admitting: Internal Medicine

## 2011-07-19 DIAGNOSIS — Z0289 Encounter for other administrative examinations: Secondary | ICD-10-CM

## 2011-08-15 ENCOUNTER — Emergency Department (HOSPITAL_COMMUNITY)
Admission: EM | Admit: 2011-08-15 | Discharge: 2011-08-16 | Disposition: A | Discharge status: Home / Self Care | Payer: PRIVATE HEALTH INSURANCE | Attending: Emergency Medicine | Admitting: Emergency Medicine

## 2011-08-15 ENCOUNTER — Encounter (HOSPITAL_COMMUNITY): Payer: Self-pay

## 2011-08-15 DIAGNOSIS — E785 Hyperlipidemia, unspecified: Secondary | ICD-10-CM | POA: Insufficient documentation

## 2011-08-15 DIAGNOSIS — F10929 Alcohol use, unspecified with intoxication, unspecified: Secondary | ICD-10-CM

## 2011-08-15 DIAGNOSIS — F1010 Alcohol abuse, uncomplicated: Secondary | ICD-10-CM | POA: Insufficient documentation

## 2011-08-15 DIAGNOSIS — I10 Essential (primary) hypertension: Secondary | ICD-10-CM | POA: Insufficient documentation

## 2011-08-15 DIAGNOSIS — Z7982 Long term (current) use of aspirin: Secondary | ICD-10-CM | POA: Insufficient documentation

## 2011-08-15 DIAGNOSIS — Z79899 Other long term (current) drug therapy: Secondary | ICD-10-CM | POA: Insufficient documentation

## 2011-08-15 LAB — COMPREHENSIVE METABOLIC PANEL WITH GFR
ALT: 24 U/L (ref 0–53)
AST: 24 U/L (ref 0–37)
Albumin: 3.8 g/dL (ref 3.5–5.2)
Alkaline Phosphatase: 47 U/L (ref 39–117)
BUN: 11 mg/dL (ref 6–23)
CO2: 25 meq/L (ref 19–32)
Calcium: 9.1 mg/dL (ref 8.4–10.5)
Chloride: 105 meq/L (ref 96–112)
Creatinine, Ser: 0.78 mg/dL (ref 0.50–1.35)
GFR calc Af Amer: >90 mL/min
GFR calc non Af Amer: >90 mL/min
Glucose, Bld: 114 mg/dL — ABNORMAL HIGH (ref 70–99)
Potassium: 3.7 meq/L (ref 3.5–5.1)
Sodium: 141 meq/L (ref 135–145)
Total Bilirubin: 0.3 mg/dL (ref 0.3–1.2)
Total Protein: 8.2 g/dL (ref 6.0–8.3)

## 2011-08-15 LAB — CBC
HCT: 44.3 % (ref 39.0–52.0)
Hemoglobin: 15.4 g/dL (ref 13.0–17.0)
MCH: 28.8 pg (ref 26.0–34.0)
MCHC: 34.8 g/dL (ref 30.0–36.0)
MCV: 82.8 fL (ref 78.0–100.0)
Platelets: 187 10*3/uL (ref 150–400)
RBC: 5.35 MIL/uL (ref 4.22–5.81)
RDW: 13 % (ref 11.5–15.5)
WBC: 8 10*3/uL (ref 4.0–10.5)

## 2011-08-15 LAB — ETHANOL: Alcohol, Ethyl (B): 141 mg/dL — ABNORMAL HIGH (ref 0–11)

## 2011-08-15 NOTE — ED Notes (Signed)
Pt refused blood work at this time.

## 2011-08-15 NOTE — ED Notes (Signed)
Pt brought in by the police, they were called out for domestic dispute called in by the neighbors, police said that he had a large knife standing behind his wife, then he said that he was suicidal and wanted to die, pt denies that at this time, now he states that he doesn't want to talk to anyone and he admits to drinking

## 2011-08-16 NOTE — ED Provider Notes (Signed)
History     CSN: [id number]  Arrival date & time 08/15/11  2156   First MD Initiated Contact with Patient 08/16/11 0024      Chief Complaint  Patient presents with  . Alcohol Intoxication     HPI  History provided by the patient. Patient is a [age]-year-old male with past history of hypertension, hyperlipidemia who presents by [employer] after having a domestic dispute at home. Patient states that he and his wife were arguing when he became very upset. The tenet downstairs and called 911. Patient does admit to holding a large knife when police arrived to his home. He states that he has no serious intention of harming his wife or himself. He agreed to come voluntarily with the police to the emergency room to be screened. Patient denies any history of psychiatric illness. Patient denies any suicidal or homicidal ideations. Patient does admit to heavy alcohol use this evening. He does report that this contributed to his increased anger. Patient states that he is much more calm now and denies any other symptoms. Patient was cooperative with police upon arrival. They have not pressed any charges. Patient is requesting to return home.    Past Medical History  Diagnosis Date  . Hyperglycemia     A1C 6.1 08-2007  . Hypogonadism male   . Hyperlipidemia   . Elevated BP     borderline  . Sleep apnea   . Hypertension     Past Surgical History  Procedure Date  . Appendectomy   . Anterior cruciate ligament repair     left  . Thumb surgery     R hand  . Anterior cruciate ligament repair     Family History  Problem Relation Age of Onset  . Colon cancer Father   . Heart disease Sister     surgery  . Allergies Sister     History  Substance Use Topics  . Smoking status: Former Smoker -- 0.5 packs/day for 5 years    Types: Cigarettes    Quit date: 07/05/1974  . Smokeless tobacco: Not on file  . Alcohol Use: Yes     socially      Review of Systems  All other systems reviewed and are  negative.    Allergies  Atorvastatin and Cyanocobalamin  Home Medications   Current Outpatient Rx  Name Route Sig Dispense Refill  . ASPIRIN 81 MG PO TABS Oral Take 81 mg by mouth daily.      Marland Kitchen FEXOFENADINE HCL 180 MG PO TABS Oral Take 180 mg by mouth daily.      . OMEGA-3 FATTY ACIDS 1000 MG PO CAPS Oral Take 1 g by mouth daily.      Marland Kitchen GLUCOSAMINE HCL 1000 MG PO TABS Oral Take 3 tablets by mouth daily.      Marland Kitchen HYDROCODONE-ACETAMINOPHEN 7.5-750 MG PO TABS Oral Take 1 tablet by mouth at bedtime as needed for pain. 30 tablet 1  . CENTRUM ULTRA MENS PO Oral Take 1 each by mouth daily.      Marland Kitchen NIACIN PO Oral Take by mouth daily.     Marland Kitchen NITROGLYCERIN 0.4 MG SL SUBL Sublingual Place 1 tablet (0.4 mg total) under the tongue every 5 (five) minutes as needed for chest pain. 50 tablet 0  . PRAVASTATIN SODIUM 40 MG PO TABS Oral Take 1 tablet (40 mg total) by mouth every evening. 30 tablet 6    BP 168/97  Pulse 110  Temp(Src) 98.2 F (36.8 C) (  Oral)  Resp 22  SpO2 99%  Physical Exam  Nursing note and vitals reviewed. Constitutional: He appears well-developed and well-nourished. No distress.  HENT:  Head: Normocephalic and atraumatic.  Mouth/Throat: Oropharynx is clear and moist.  Neck: Normal range of motion. Neck supple.  Cardiovascular: Normal rate and regular rhythm.   Pulmonary/Chest: Effort normal and breath sounds normal. No respiratory distress.  Abdominal: Soft. He exhibits no distension. There is no rebound.  Neurological: He is alert.  Skin: Skin is warm. No rash noted.  Psychiatric: He has a normal mood and affect. His behavior is normal. Judgment and thought content normal.    ED Course  Procedures    Results for orders placed during the hospital encounter of 08/15/11  CBC      Component Value Range   WBC 8.0  4.0 - 10.5 (K/uL)   RBC 5.35  4.22 - 5.81 (MIL/uL)   Hemoglobin 15.4  13.0 - 17.0 (g/dL)   HCT 41.3  24.4 - 01.0 (%)   MCV 82.8  78.0 - 100.0 (fL)   MCH  28.8  26.0 - 34.0 (pg)   MCHC 34.8  30.0 - 36.0 (g/dL)   RDW 27.2  53.6 - 64.4 (%)   Platelets 187  150 - 400 (K/uL)  COMPREHENSIVE METABOLIC PANEL      Component Value Range   Sodium 141  135 - 145 (mEq/L)   Potassium 3.7  3.5 - 5.1 (mEq/L)   Chloride 105  96 - 112 (mEq/L)   CO2 25  19 - 32 (mEq/L)   Glucose, Bld 114 (*) 70 - 99 (mg/dL)   BUN 11  6 - 23 (mg/dL)   Creatinine, Ser 0.34  0.50 - 1.35 (mg/dL)   Calcium 9.1  8.4 - 74.2 (mg/dL)   Total Protein 8.2  6.0 - 8.3 (g/dL)   Albumin 3.8  3.5 - 5.2 (g/dL)   AST 24  0 - 37 (U/L)   ALT 24  0 - 53 (U/L)   Alkaline Phosphatase 47  39 - 117 (U/L)   Total Bilirubin 0.3  0.3 - 1.2 (mg/dL)   GFR calc non Af Amer >90  >90 (mL/min)   GFR calc Af Amer >90  >90 (mL/min)  ETHANOL      Component Value Range   Alcohol, Ethyl (B) 141 (*) 0 - 11 (mg/dL)      1. Alcohol intoxication       MDM  12:30 AM agency and evaluated. Patient no acute distress.   Patient discussed with attending physician. Patient does not express any SI/HI. Patient does admit to alcohol use. Patient arrived after a domestic dispute. Patient has no significant psychiatric history. At this time patient is able to be discharged home. He will be taking a taxi.     Angus Seller, [place] 08/16/11 [id number]

## 2011-08-16 NOTE — ED Provider Notes (Signed)
Medical screening examination/treatment/procedure(s) were performed by non-physician practitioner and as supervising physician I was immediately available for consultation/collaboration.   Jyssica Rief L Annalaura Sauseda, MD 08/16/11 0757 

## 2011-09-10 ENCOUNTER — Ambulatory Visit: Payer: PRIVATE HEALTH INSURANCE | Admitting: *Deleted

## 2012-07-15 IMAGING — CR DG CHEST 2V
2 series · 2 of 2 positions shown · non-contrast
Comparison: None.

CLINICAL DATA: Chest pain, shortness of breath

CHEST - 2 VIEW

[view not recorded (1 of 2)]
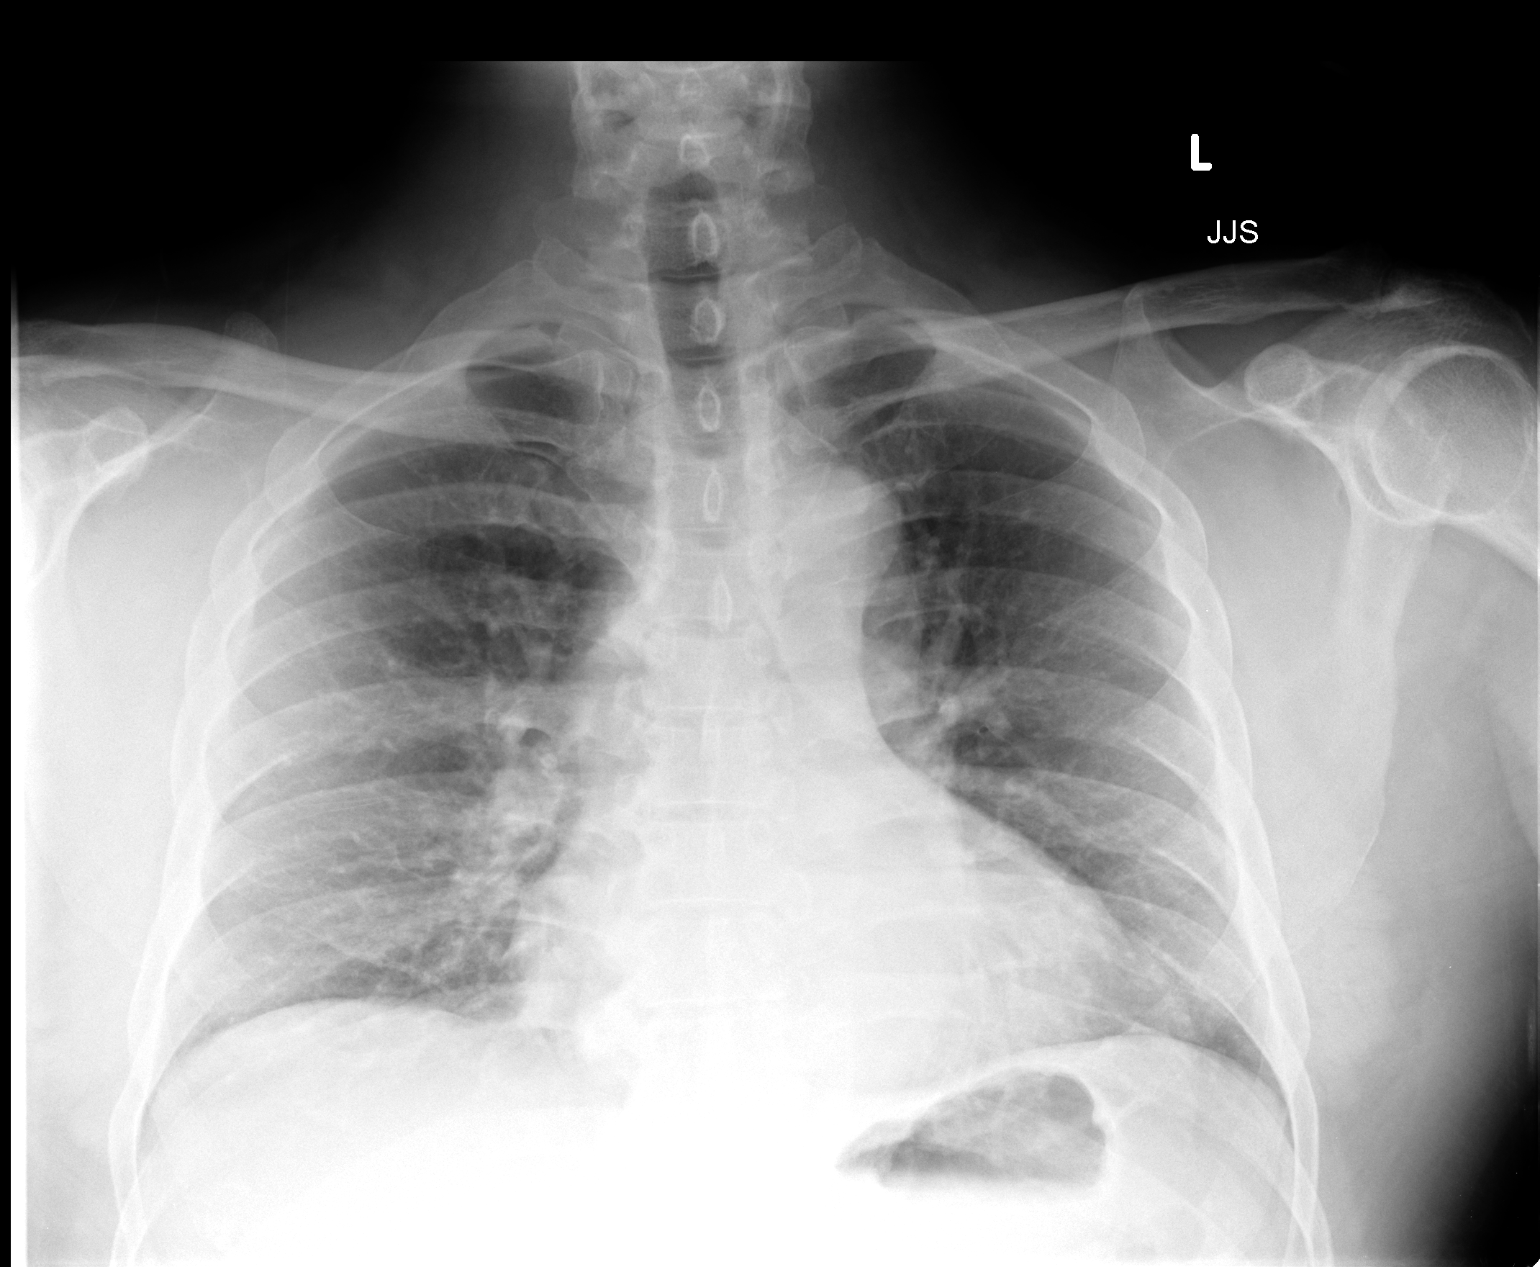

[view not recorded (2 of 2)]
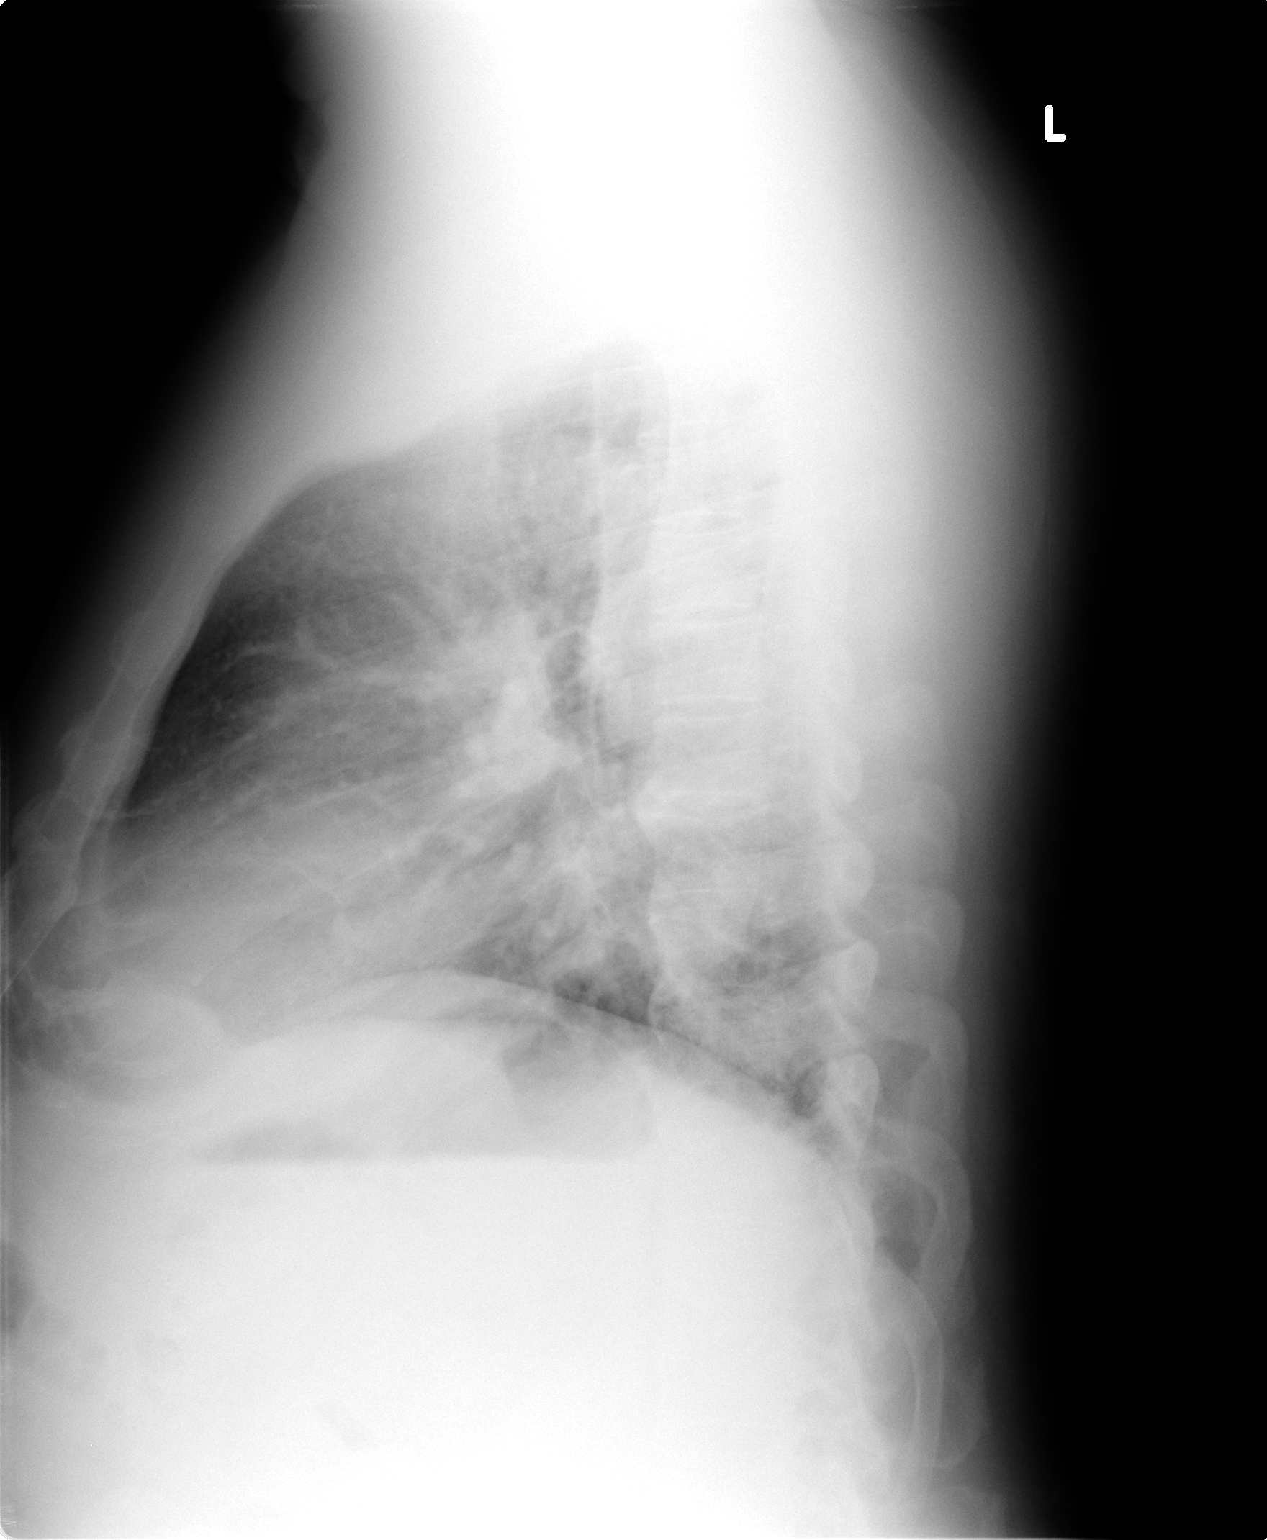

[2 of 2 positions shown; findings below may reference images not displayed]

FINDINGS: The lungs are clear.  Mediastinal contours are normal.
The heart is mildly enlarged.  There are degenerative changes
throughout the thoracic spine.
IMPRESSION: Mild cardiomegaly.  No active lung disease.

## 2015-01-03 ENCOUNTER — Ambulatory Visit (INDEPENDENT_AMBULATORY_CARE_PROVIDER_SITE_OTHER)
Admission: RE | Admit: 2015-01-03 | Discharge: 2015-01-03 | Disposition: A | Discharge status: Home / Self Care | Payer: Managed Care, Other (non HMO) | Source: Ambulatory Visit | Attending: Internal Medicine | Admitting: Internal Medicine

## 2015-01-03 ENCOUNTER — Ambulatory Visit (INDEPENDENT_AMBULATORY_CARE_PROVIDER_SITE_OTHER): Payer: Managed Care, Other (non HMO) | Admitting: Internal Medicine

## 2015-01-03 ENCOUNTER — Encounter: Payer: Self-pay | Admitting: Internal Medicine

## 2015-01-03 VITALS — BP 132/84 | HR 90 | Ht 66.0 in | Wt 236.0 lb

## 2015-01-03 DIAGNOSIS — E669 Obesity, unspecified: Secondary | ICD-10-CM

## 2015-01-03 DIAGNOSIS — R058 Other specified cough: Secondary | ICD-10-CM

## 2015-01-03 DIAGNOSIS — R05 Cough: Secondary | ICD-10-CM

## 2015-01-03 DIAGNOSIS — R0609 Other forms of dyspnea: Secondary | ICD-10-CM

## 2015-01-03 MED ORDER — PANTOPRAZOLE SODIUM 40 MG PO TBEC: 40.0000 mg | DELAYED_RELEASE_TABLET | Freq: Every day | ORAL | Status: AC

## 2015-01-03 MED ORDER — FAMOTIDINE 20 MG PO TABS: ORAL_TABLET | ORAL | Status: AC

## 2015-01-03 NOTE — Progress Notes (Signed)
Quick Note:  Spoke with pt and notified of results per Dr. Wert. Pt verbalized understanding and denied any questions.  ______ 

## 2015-01-03 NOTE — Patient Instructions (Addendum)
Stop inhaler and fish oil and eat more fish - baked or broiled especially salmon   Pantoprazole (protonix) 40 mg   Take  30-60 min before first meal of the day and Pepcid (famotidine)  20 mg one @  bedtime until return to office - this is the best way to tell whether stomach acid is contributing to your problem.    GERD (REFLUX)  is an extremely common cause of respiratory symptoms just like yours , many times with no obvious heartburn at all.    It can be treated with medication, but also with lifestyle changes including elevation of the head of your bed (ideally with 6 inch  bed blocks),  Smoking cessation, avoidance of late meals, excessive alcohol, and avoid fatty foods, chocolate, peppermint, colas, red wine, and acidic juices such as orange juice.  NO MINT OR MENTHOL PRODUCTS SO NO COUGH DROPS  USE SUGARLESS CANDY INSTEAD (Jolley ranchers or Stover's or Life Savers) or even ice chips will also do - the key is to swallow to prevent all throat clearing. NO OIL BASED VITAMINS - use powdered substitutes.   Please remember to go to the lab and x-ray department downstairs for your tests - we will call you with the results when they are available.  Weight control is simply a matter of calorie balance which needs to be tilted in your favor by eating less and exercising more.  To get the most out of exercise, you need to be continuously aware that you are short of breath, but never out of breath, for 30 minutes daily. As you improve, it will actually be easier for you to do the same amount of exercise  in  30 minutes so always push to the level where you are short of breath.  If this does not result in gradual weight reduction then I strongly recommend you see a nutritionist with a food diary x 2 weeks so that we can work out a negative calorie balance which is universally effective in steady weight loss programs.  Think of your calorie balance like you do your bank account where in this case you want  the balance to go down so you must take in less calories than you burn up.  It's just that simple:  Hard to do, but easy to understand.  Good luck!       Please schedule a follow up office visit in 6 weeks, call sooner if needed  Late add Labs ordered but not done

## 2015-01-03 NOTE — Progress Notes (Signed)
Subjective:    Patient ID: Bobby Clayton, male    DOB: [birth year]   MRN: [id number]  HPI   64 yo [place] male never regular smoker with unexplained sob x 2011 prev neg eval by [facility] with new daily dry cough/ noct wheeze     01/03/2015 [facility] office visit/ Bobby Clayton   Chief Complaint  Patient presents with  . Pulmonary Consult    Former pt of Dr Shelle Ironlance. Pt last seen in 2012. He states that his breathing is no better since then. He also c/o occ cough- prod with white sputum. He also c/o wheezing at night when he first lies down.    dyspnea on exertion for the 2010 and does not feel that it is getting worse. However, he admits that he is not pushing himself currently as in the past. He can walk long distances on flat ground fairly well, but will get winded with any type of incline. He is currently walking on a treadmill at 3 miles per hour for 30 minutes, and doing this at least 3 times a week. He does admit that he will get winded bringing jheavy groceries in from the car, and also going up one flight of stairs Has inhaler not listed in epic but doesn't think it helps   No excess or purulent sputum production but cough worse at hs assoc with subj wheeze but doesn't disturb his sleep or flare in am  No obvious other patterns in day to day or daytime variabilty or assoc  cp or chest tightness, subjective wheeze overt sinus or hb symptoms. No unusual exp hx or h/o childhood pna/ asthma or knowledge of premature birth.  Sleeping ok without nocturnal  or early am exacerbation  of respiratory  c/o's or need for noct saba. Also denies any obvious fluctuation of symptoms with weather or environmental changes or other aggravating or alleviating factors except as outlined above   Current Medications, Allergies, Complete Past Medical History, Past Surgical History, Family History, and Social History were reviewed in [facility] electronic medical record.         .    Review of  Systems  Constitutional: Negative for fever, chills, activity change, appetite change and unexpected weight change.  HENT: Negative for congestion, dental problem, postnasal drip, rhinorrhea, sneezing, sore throat, trouble swallowing and voice change.   Eyes: Negative for visual disturbance.  Respiratory: Positive for cough and shortness of breath. Negative for choking.   Cardiovascular: Negative for chest pain and leg swelling.  Gastrointestinal: Negative for nausea, vomiting and abdominal pain.  Genitourinary: Negative for difficulty urinating.  Musculoskeletal: Negative for arthralgias.  Skin: Negative for rash.  Psychiatric/Behavioral: Negative for behavioral problems and confusion.       Objective:   Physical Exam  Wt Readings from Last 3 Encounters:  01/03/15 236 lb (107.049 kg)  07/13/11 234 lb 14.4 oz (106.55 kg)  06/11/11 241 lb 6.4 oz (109.498 kg)    Vital signs reviewed    HEENT: nl dentition, turbinates, and orophanx. Nl external ear canals without cough reflex   NECK :  without JVD/Nodes/TM/ nl carotid upstrokes bilaterally   LUNGS: no acc muscle use, clear to A and P bilaterally without cough on insp or exp maneuvers   CV:  RRR  no s3 or murmur or increase in P2, no edema   ABD:  soft and nontender with nl excursion in the supine position. No bruits or organomegaly, bowel sounds nl  MS:  warm  without deformities, calf tenderness, cyanosis or clubbing  SKIN: warm and dry without lesions    NEURO:  alert, approp, no deficits     I personally reviewed images and agree with radiology impression as follows:  CXR:  01/03/15 Mild hypoinflation accentuates the pulmonary interstitial markings. There are mildly increased but are stable. There is stable mild cardiomegaly. There is no acute cardiopulmonary abnormality.   Labs ordered but not done          Assessment & Plan:

## 2015-01-04 ENCOUNTER — Encounter: Payer: Self-pay | Admitting: Internal Medicine

## 2015-01-04 DIAGNOSIS — R058 Other specified cough: Secondary | ICD-10-CM | POA: Insufficient documentation

## 2015-01-04 DIAGNOSIS — R05 Cough: Secondary | ICD-10-CM | POA: Insufficient documentation

## 2015-01-04 DIAGNOSIS — E669 Obesity, unspecified: Secondary | ICD-10-CM | POA: Insufficient documentation

## 2015-01-04 NOTE — Assessment & Plan Note (Signed)
PFTs 06/16/11  Nl flows/ volumes except ERV 46% c/w effects of obesity   Symptoms are markedly disproportionate to objective findings and not clear this is a lung problem but pt does appear to have difficult airway management issues. DDX of  difficult airways management all start with A and  include Adherence, Ace Inhibitors, Acid Reflux, Active Sinus Disease, Alpha 1 Antitripsin deficiency, Anxiety masquerading as Airways dz,  ABPA,  allergy(esp in young), Aspiration (esp in elderly), Adverse effects of meds,  Active smokers, A bunch of PE's (a small clot burden can't cause this syndrome unless there is already severe underlying pulm or vascular dz with poor reserve) plus two Bs  = Bronchiectasis and Beta blocker use..and one C= CHF  Adherence is always the initial "prime suspect" and is a multilayered concern that requires a "trust but verify" approach in every patient - starting with knowing how to use medications, especially inhalers, correctly, keeping up with refills and understanding the fundamental difference between maintenance and prns vs those medications only taken for a very short course and then stopped and not refilled.   ? Acid (or non-acid) GERD > would explain some of his coughing and always difficult to exclude as up to 75% of pts in some series report no assoc GI/ Heartburn symptoms> rec max (24h)  acid suppression and diet restrictions/ reviewed and instructions given in writing.   ? Anxiety related to wt gain/ deconditioning  > see obesity   ? chf > check bnp   Really needs cpst to complete the w/u if not seeing significant improvement with regular submaximal ex  I had an extended discussion with the patient reviewing all relevant studies completed to date x 323m   Each maintenance medication was reviewed in detail including most importantly the difference between maintenance and prns and under what circumstances the prns are to be triggered using an action plan format that is  not reflected in the computer generated alphabetically organized AVS.    Please see instructions for details which were reviewed in writing and the patient given a copy highlighting the part that I personally wrote and discussed at today's ov.

## 2015-01-04 NOTE — Assessment & Plan Note (Signed)
The most common causes of chronic cough in immunocompetent adults include the following: upper airway cough syndrome (UACS), previously referred to as postnasal drip syndrome (PNDS), which is caused by variety of rhinosinus conditions; (2) asthma; (3) GERD; (4) chronic bronchitis from cigarette smoking or other inhaled environmental irritants; (5) nonasthmatic eosinophilic bronchitis; and (6) bronchiectasis.   These conditions, singly or in combination, have accounted for up to 94% of the causes of chronic cough in prospective studies.   Other conditions have constituted no >6% of the causes in prospective studies These have included bronchogenic carcinoma, chronic interstitial pneumonia, sarcoidosis, left ventricular failure, ACEI-induced cough, and aspiration from a condition associated with pharyngeal dysfunction.    Chronic cough is often simultaneously caused by more than one condition. A single cause has been found from 38 to 82% of the time, multiple causes from 18 to 62%. Multiply caused cough has been the result of three diseases up to 42% of the time.       Based on hx and exam, this is most likely:  Classic Upper airway cough syndrome, so named because it's frequently impossible to sort out how much is  CR/sinusitis with freq throat clearing (which can be related to primary GERD)   vs  causing  secondary (" extra esophageal")  GERD from wide swings in gastric pressure that occur with throat clearing, often  promoting self use of mint and menthol lozenges that reduce the lower esophageal sphincter tone and exacerbate the problem further in a cyclical fashion.   These are the same pts (now being labeled as having "irritable larynx syndrome" by some cough centers) who not infrequently have a history of having failed to tolerate ace inhibitors,  dry powder inhalers or biphosphonates or report having atypical reflux symptoms that don't respond to standard doses of PPI , and are easily confused as  having aecopd or asthma flares by even experienced allergists/ pulmonologists.   The first step is to maximize acid suppression and  then regroup if the cough persists.

## 2015-01-04 NOTE — Assessment & Plan Note (Signed)
Body mass index is 38.11 kg/(m^2).  Lab Results  Component Value Date   TSH 0.90 03/29/2011     Contributing to gerd tendency/ doe/ needs to achieve and maintain neg calorie balance >  See instructions for specific recommendations which were reviewed directly with the patient who was given a copy with highlighter outlining the key components.   Needs tsh on return if not done in meantime

## 2015-02-14 ENCOUNTER — Ambulatory Visit: Payer: Managed Care, Other (non HMO) | Admitting: Internal Medicine

## 2016-04-21 IMAGING — CR DG CHEST 2V
2 series · 2 of 2 positions shown · non-contrast
Comparison: PA and lateral chest of March 29, 2011

CLINICAL DATA: Chronic dyspnea on exertion, history of sleep apnea,
hypertension, previous tobacco use.

EXAM:
CHEST  2 VIEW

[view not recorded (1 of 2)]
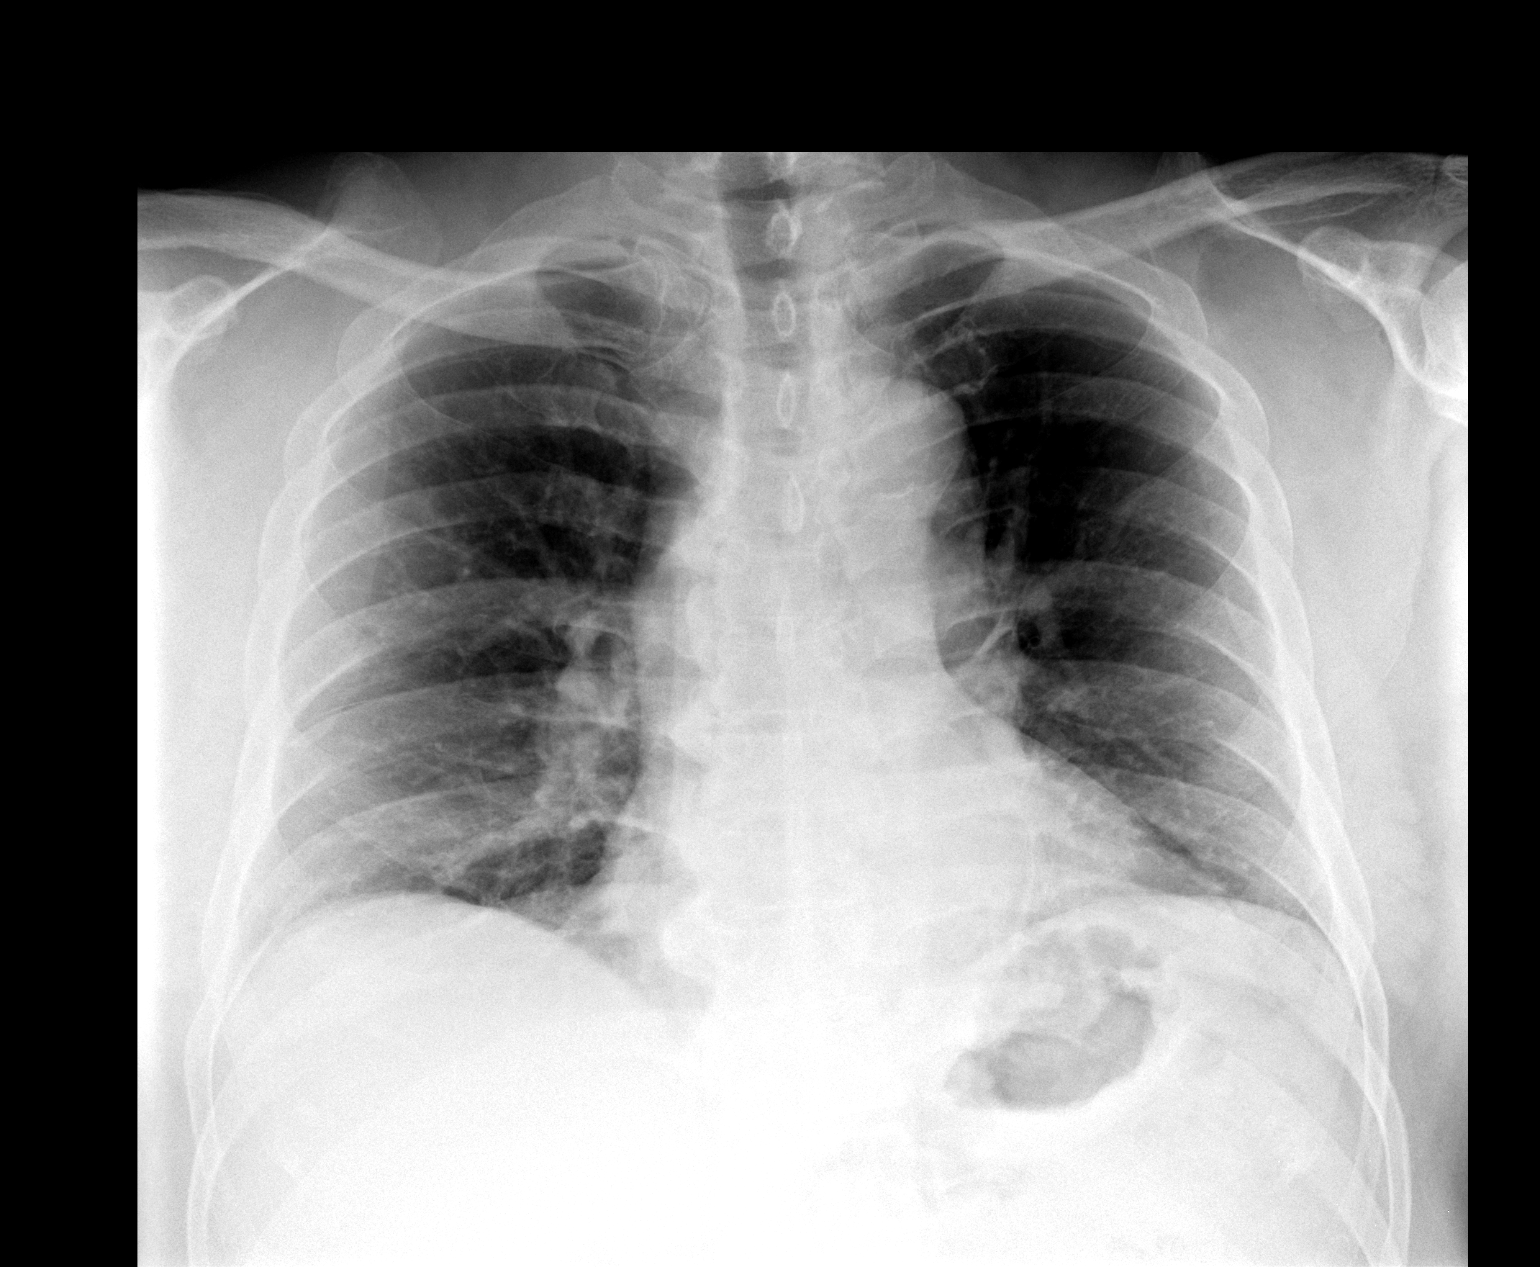

[view not recorded (2 of 2)]
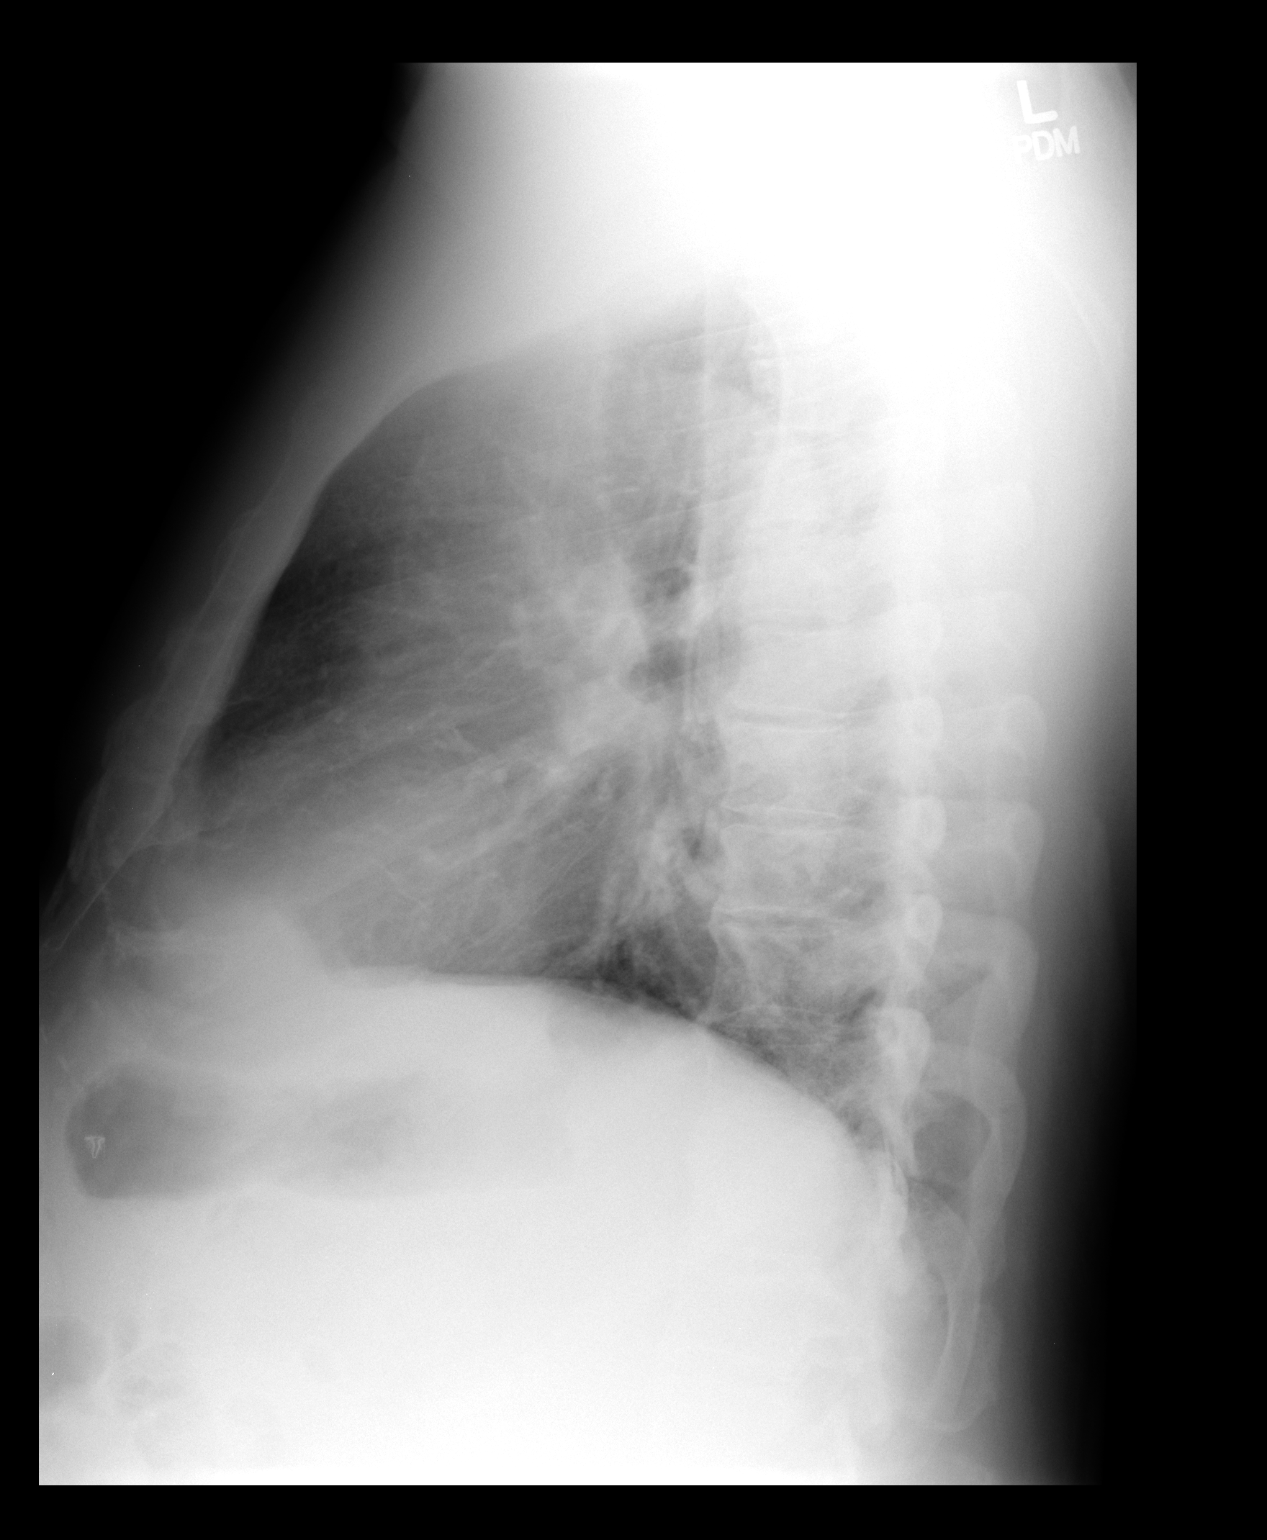

[2 of 2 positions shown; findings below may reference images not displayed]

FINDINGS: The lungs are mildly hypoinflated. The interstitial markings are
coarse though stable. There is no alveolar infiltrate. There is no
pleural effusion. The heart is top-normal in size and stable. The
central pulmonary vascularity is mildly prominent but also stable.
There is tortuosity of the descending thoracic aorta. The bony
thorax is unremarkable.
IMPRESSION: Mild hypoinflation accentuates the pulmonary interstitial markings.
There are mildly increased but are stable. There is stable mild
cardiomegaly. There is no acute cardiopulmonary abnormality.
